# Patient Record
Sex: Male | Born: 1985 | Race: Black or African American | Hispanic: No | Marital: Married | Smoking: Light tobacco smoker
Health system: Southern US, Community
[De-identification: ages and names within clinical notes are randomized; demographics above are authoritative.]

## PROBLEM LIST (undated history)

## (undated) DIAGNOSIS — F32A Depression, unspecified: Secondary | ICD-10-CM

## (undated) DIAGNOSIS — F329 Major depressive disorder, single episode, unspecified: Secondary | ICD-10-CM

## (undated) HISTORY — PX: ROOT CANAL: SHX2363

## (undated) HISTORY — PX: WISDOM TOOTH EXTRACTION: SHX21

## (undated) HISTORY — DX: Depression, unspecified: F32.A

## (undated) HISTORY — DX: Major depressive disorder, single episode, unspecified: F32.9

---
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
## 2009-09-08 ENCOUNTER — Emergency Department: Payer: Self-pay | Admitting: Emergency Medicine

## 2011-09-08 IMAGING — CR DG CHEST 2V
1 series · 2 of 2 positions shown · non-contrast
Comparison: none

REASON FOR EXAM: chest pain
COMMENTS:   May transport without cardiac monitor

PROCEDURE:     DXR - DXR CHEST PA (OR AP) AND LATERAL  - September 08, 2009  [DATE]
RESULT:     The lung fields are clear. The heart, mediastinal and osseous
structures show no significant abnormalities.

[Series 1: view not recorded · 0.17mm/px · 2 of 2 slices shown]
[im 1/2]
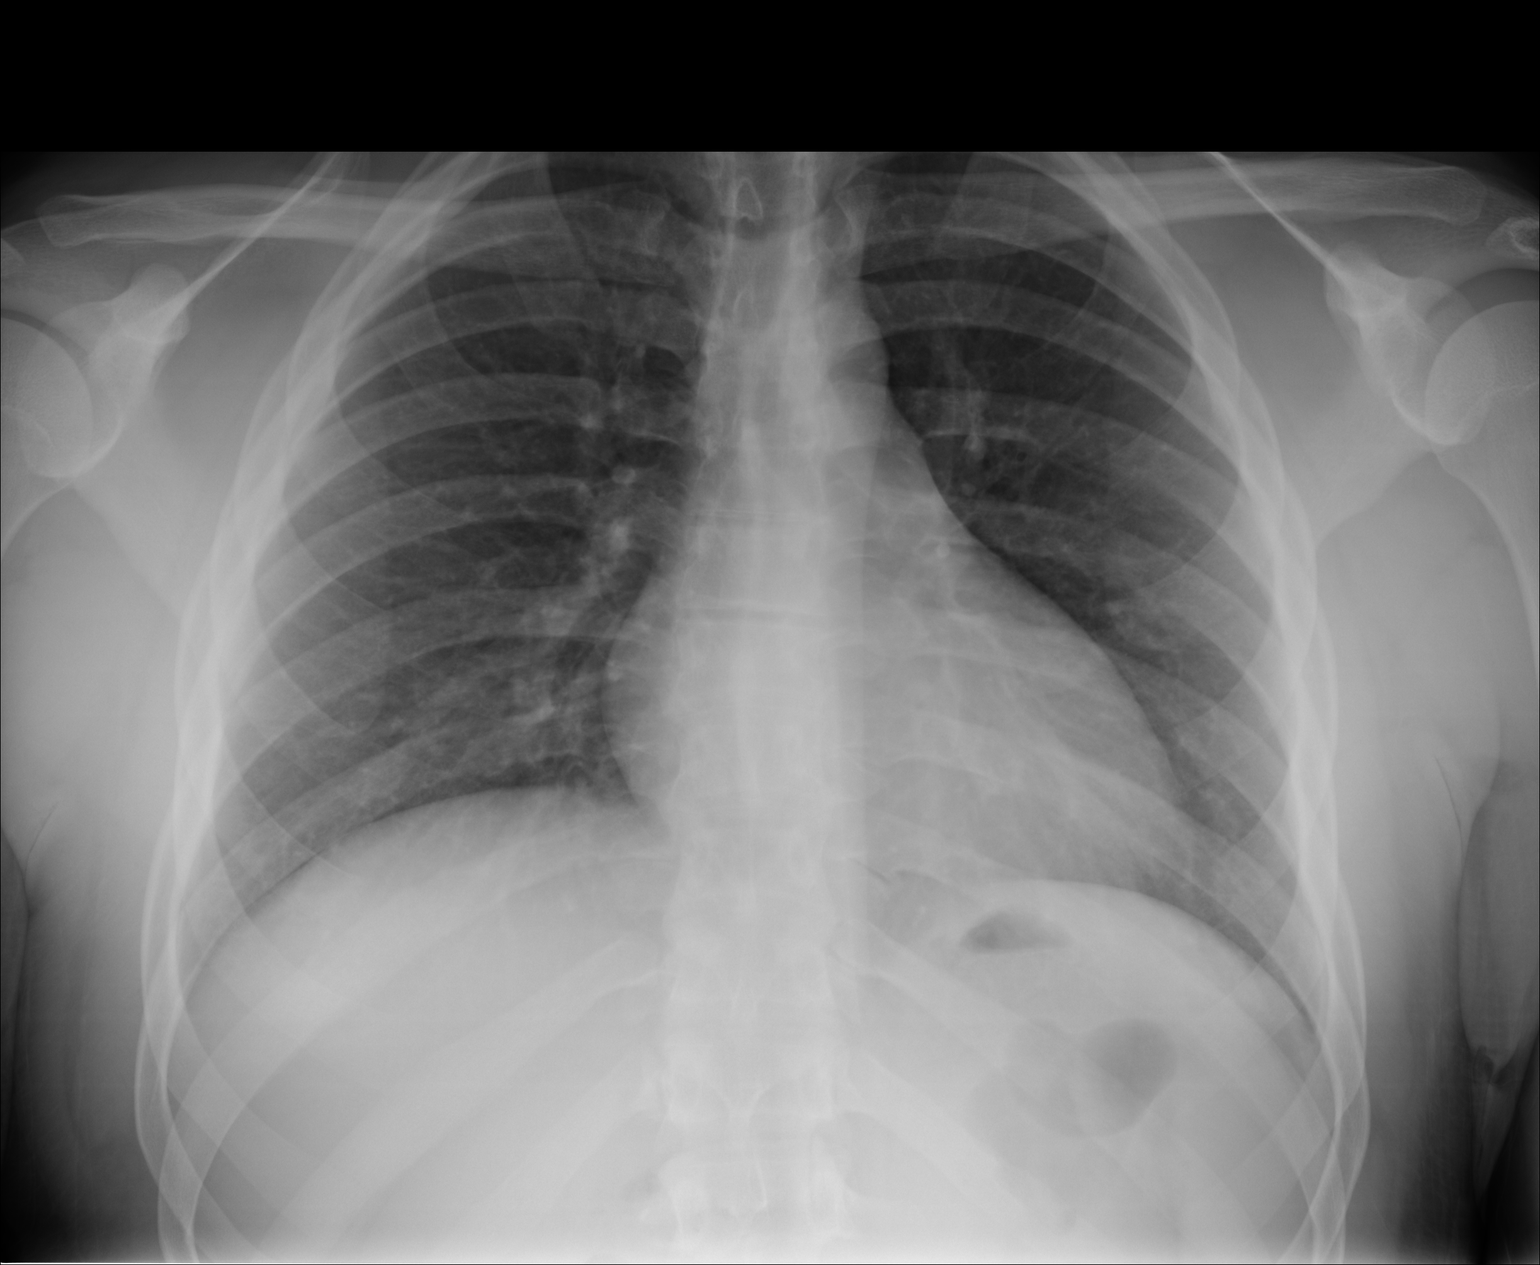
[im 2/2]
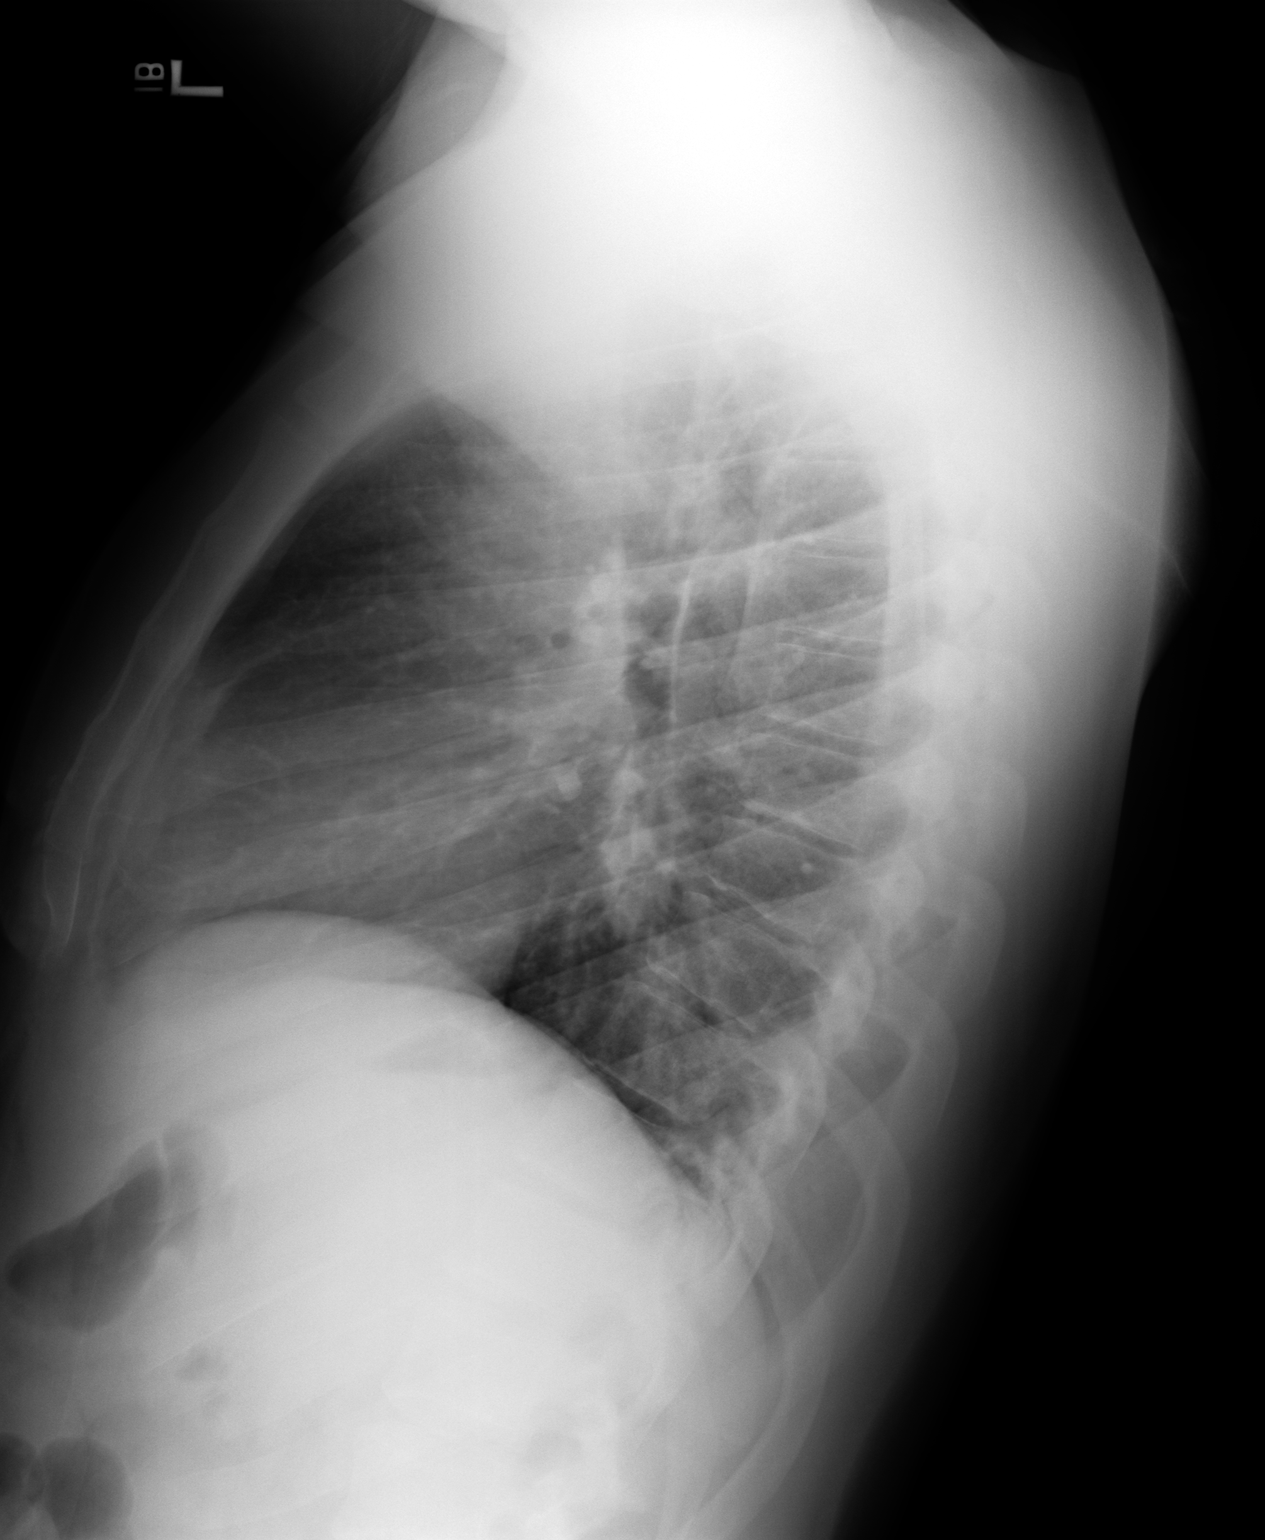

[2 of 2 positions shown; findings below may reference images not displayed]

IMPRESSION: No significant abnormalities are noted.

## 2012-01-24 ENCOUNTER — Emergency Department: Payer: Self-pay | Admitting: Emergency Medicine

## 2012-06-29 ENCOUNTER — Ambulatory Visit: Payer: Self-pay | Admitting: Emergency Medicine

## 2012-08-17 ENCOUNTER — Emergency Department: Payer: Self-pay | Admitting: Emergency Medicine

## 2013-08-29 ENCOUNTER — Ambulatory Visit: Payer: Self-pay

## 2014-10-31 ENCOUNTER — Encounter: Payer: Self-pay | Admitting: Emergency Medicine

## 2014-10-31 ENCOUNTER — Emergency Department
Admission: EM | Admit: 2014-10-31 | Discharge: 2014-10-31 | Disposition: A | Discharge status: Home / Self Care | Payer: Managed Care, Other (non HMO) | Attending: Emergency Medicine | Admitting: Emergency Medicine

## 2014-10-31 DIAGNOSIS — N342 Other urethritis: Secondary | ICD-10-CM | POA: Diagnosis not present

## 2014-10-31 DIAGNOSIS — Z720 Tobacco use: Secondary | ICD-10-CM | POA: Diagnosis not present

## 2014-10-31 DIAGNOSIS — N4889 Other specified disorders of penis: Secondary | ICD-10-CM | POA: Diagnosis present

## 2014-10-31 LAB — CHLAMYDIA/NGC RT PCR (ARMC ONLY)
Chlamydia Tr: NOT DETECTED
N gonorrhoeae: NOT DETECTED

## 2014-10-31 MED ORDER — AZITHROMYCIN 250 MG PO TABS
1000.0000 mg | ORAL_TABLET | Freq: Once | ORAL | Status: AC
  Administered 2014-10-31: 1000 mg via ORAL
  Filled 2014-10-31: qty 4

## 2014-10-31 MED ORDER — CEFTRIAXONE SODIUM 250 MG IJ SOLR
250.0000 mg | Freq: Once | INTRAMUSCULAR | Status: AC
  Administered 2014-10-31: 250 mg via INTRAMUSCULAR
  Filled 2014-10-31: qty 250

## 2014-10-31 NOTE — Discharge Instructions (Signed)
Urethritis, Adult °Urethritis is an inflammation of the tube through which urine exits your bladder (urethra).  °CAUSES °Urethritis is often caused by an infection in your urethra. The infection can be viral, like herpes. The infection can also be bacterial, like gonorrhea. °RISK FACTORS °Risk factors of urethritis include: °· Having sex without using a condom. °· Having multiple sexual partners. °· Having poor hygiene. °SIGNS AND SYMPTOMS °Symptoms of urethritis are less noticeable in women than in men. These symptoms include: °· Burning feeling when you urinate (dysuria). °· Discharge from your urethra. °· Blood in your urine (hematuria). °· Urinating more than usual. °DIAGNOSIS  °To confirm a diagnosis of urethritis, your health care provider will do the following: °· Ask about your sexual history. °· Perform a physical exam. °· Have you provide a sample of your urine for lab testing. °· Use a cotton swab to gently collect a sample from your urethra for lab testing. °TREATMENT  °It is important to treat urethritis. Depending on the cause, untreated urethritis may lead to serious genital infections and possibly infertility. Urethritis caused by a bacterial infection is treated with antibiotic medicine. All sexual partners must be treated.  °HOME CARE INSTRUCTIONS °· Do not have sex until the test results are known and treatment is completed, even if your symptoms go away before you finish treatment. °· If you were prescribed an antibiotic, finish it all even if you start to feel better. °SEEK MEDICAL CARE IF:  °· Your symptoms are not improved in 3 days. °· Your symptoms are getting worse. °· You develop abdominal pain or pelvic pain (in women). °· You develop joint pain. °· You have a fever. °SEEK IMMEDIATE MEDICAL CARE IF:  °· You have severe pain in the belly, back, or side. °· You have repeated vomiting. °MAKE SURE YOU: °· Understand these instructions. °· Will watch your condition. °· Will get help right away  if you are not doing well or get worse. °  °This information is not intended to replace advice given to you by your health care provider. Make sure you discuss any questions you have with your health care provider. °  °Document Released: 06/22/2000 Document Revised: 05/13/2014 Document Reviewed: 08/27/2012 °Elsevier Interactive Patient Education ©2016 [employer] ° °

## 2014-10-31 NOTE — ED Notes (Addendum)
Pt to ED with pain to penis, states he is afraid he may have chlamydia, denies any urinary symptoms

## 2014-10-31 NOTE — ED Notes (Signed)
Patient c/o penile pain. Denies any urinary problems or abnormal discharge

## 2014-10-31 NOTE — ED Provider Notes (Signed)
[facility] Emergency Department Provider Note  ____________________________________________  Time seen: Approximately 2:23 PM  I have reviewed the triage vital signs and the nursing notes.   HISTORY  Chief Complaint SEXUALLY TRANSMITTED DISEASE    HPI Lance Harvey is a 29 y.o. male presents for evaluation of the penis pain. States he is a very may have Chlamydia. States that he had oral sex unprotected yesterday. Denies any known discharge.   History reviewed. No pertinent past medical history.  There are no active problems to display for this patient.   History reviewed. No pertinent past surgical history.  No current outpatient prescriptions on file.  Allergies Review of patient's allergies indicates no known allergies.  No family history on file.  Social History Social History  Substance Use Topics  . Smoking status: Current Every Day Smoker    Types: Cigarettes, Cigars  . Smokeless tobacco: None  . Alcohol Use: Yes    Review of Systems Constitutional: No fever/chills Eyes: No visual changes. ENT: No sore throat. Cardiovascular: Denies chest pain. Respiratory: Denies shortness of breath. Gastrointestinal: No abdominal pain.  No nausea, no vomiting.  No diarrhea.  No constipation. Genitourinary: Positive for mild dysuria, negative for discharge. Musculoskeletal: Negative for back pain. Skin: Negative for rash. Neurological: Negative for headaches, focal weakness or numbness.  10-point ROS otherwise negative.  ____________________________________________   PHYSICAL EXAM:  VITAL SIGNS: ED Triage Vitals  Enc Vitals Group     BP 10/31/14 1405 146/57 mmHg     Pulse Rate 10/31/14 1405 83     Resp 10/31/14 1405 18     Temp 10/31/14 1405 98.9 F (37.2 C)     Temp Source 10/31/14 1405 Oral     SpO2 10/31/14 1405 99 %     Weight 10/31/14 1405 280 lb (127.007 kg)     Height 10/31/14 1405  (1.88 m)     Head Cir --       Peak Flow --      Pain Score 10/31/14 1405 2     Pain Loc --      Pain Edu? --      Excl. in GC? --     Constitutional: Alert and oriented. Well appearing and in no acute distress. Eyes: Conjunctivae are normal. PERRL. EOMI. Head: Atraumatic. Nose: No congestion/rhinnorhea. Mouth/Throat: Mucous membranes are moist.  Oropharynx non-erythematous. Neck: No stridor.   Cardiovascular: Normal rate, regular rhythm. Grossly normal heart sounds.  Good peripheral circulation. Respiratory: Normal respiratory effort.  No retractions. Lungs CTAB. Gastrointestinal: Soft and nontender. No distention. No abdominal bruits. No CVA tenderness. Genitourinary: Uncircumcised man with no obvious discharge. Nontender to palpation. Musculoskeletal: No lower extremity tenderness nor edema.  No joint effusions. Neurologic:  Normal speech and language. No gross focal neurologic deficits are appreciated. No gait instability. Skin:  Skin is warm, dry and intact. No rash noted. Psychiatric: Mood and affect are normal. Speech and behavior are normal.  ____________________________________________   LABS (all labs ordered are listed, but only abnormal results are displayed)  Labs Reviewed  CHLAMYDIA/NGC RT PCR ([facility] ONLY)   ____________________________________________  PROCEDURES  Procedure(s) performed: None  Critical Care performed: No  ____________________________________________   INITIAL IMPRESSION / ASSESSMENT AND PLAN / ED COURSE  Pertinent labs & imaging results that were available during my care of the patient were reviewed by me and considered in my medical decision making (see chart for details).  Acute urethritis. Rx Zithromax 1 g given by mouth while  in the ED along with Rocephin 250 mg IM. Patient follow-up with PCP or return with any worsening or developing symptomology. Patient voices no other emergency medical complaints at this  time. ____________________________________________   FINAL CLINICAL IMPRESSION(S) / ED DIAGNOSES  Final diagnoses:  Urethritis      Lance Dakinharles M Demonica Farrey, PA-C 10/31/14 1601  Sharyn CreamerMark Quale, MD 11/01/14 [phone]

## 2015-05-11 ENCOUNTER — Ambulatory Visit
Admission: EM | Admit: 2015-05-11 | Discharge: 2015-05-11 | Discharge status: Absent without leave | Payer: Managed Care, Other (non HMO)

## 2015-05-12 ENCOUNTER — Ambulatory Visit (INDEPENDENT_AMBULATORY_CARE_PROVIDER_SITE_OTHER): Payer: Managed Care, Other (non HMO) | Admitting: Family Medicine

## 2015-05-12 ENCOUNTER — Encounter: Payer: Self-pay | Admitting: Family Medicine

## 2015-05-12 VITALS — BP 110/78 | HR 68 | Resp 16 | Ht 74.0 in | Wt 237.0 lb

## 2015-05-12 DIAGNOSIS — R079 Chest pain, unspecified: Secondary | ICD-10-CM | POA: Diagnosis not present

## 2015-05-12 DIAGNOSIS — Z720 Tobacco use: Secondary | ICD-10-CM

## 2015-05-12 DIAGNOSIS — F418 Other specified anxiety disorders: Secondary | ICD-10-CM | POA: Diagnosis not present

## 2015-05-12 DIAGNOSIS — F419 Anxiety disorder, unspecified: Secondary | ICD-10-CM

## 2015-05-12 DIAGNOSIS — F410 Panic disorder [episodic paroxysmal anxiety] without agoraphobia: Secondary | ICD-10-CM

## 2015-05-12 DIAGNOSIS — F17200 Nicotine dependence, unspecified, uncomplicated: Secondary | ICD-10-CM | POA: Insufficient documentation

## 2015-05-12 DIAGNOSIS — Z23 Encounter for immunization: Secondary | ICD-10-CM | POA: Diagnosis not present

## 2015-05-12 DIAGNOSIS — F329 Major depressive disorder, single episode, unspecified: Secondary | ICD-10-CM

## 2015-05-12 DIAGNOSIS — E669 Obesity, unspecified: Secondary | ICD-10-CM | POA: Diagnosis not present

## 2015-05-12 MED ORDER — CITALOPRAM HYDROBROMIDE 20 MG PO TABS: 20.0000 mg | ORAL_TABLET | Freq: Every day | ORAL | Status: DC

## 2015-05-12 NOTE — Progress Notes (Signed)
Date:  05/12/2015   Name:  Lance Harvey   DOB:  27-Apr-1985   MRN:  [id number]  PCP:  Schuyler Amor, MD    Chief Complaint: Establish Care and Anxiety   History of Present Illness:  This is a 30 y.o. male seen for initial visit. Developed diffuse chest pain and palpitations last night, now resolved. BP 140/81 at work. 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 Irritable, can't calm down, gets panic episodes where needs to sit in dark room or go for drive. Smokes few cigs and MJH daily but wants to quit. Denies sign alcohol use. Weight down 25-30# recently with increased exercise at work. Never on psych med in past. Father with DM and cardiac stents, mother with HTN, brothers healthy. Tet imm status unknown.  Review of Systems:  Review of Systems  Constitutional: Negative for fever and chills.  Respiratory: Negative for cough and shortness of breath.   Cardiovascular: Negative for leg swelling.  Endocrine: Negative for polyuria.  Genitourinary: Negative for difficulty urinating.  Neurological: Negative for syncope and light-headedness.    Patient Active Problem List   Diagnosis Date Noted  . Smoker 05/12/2015  . Panic disorder 05/12/2015    Prior to Admission medications   Medication Sig Start Date End Date Taking? Authorizing Provider  citalopram (CELEXA) 20 MG tablet Take 1 tablet (20 mg total) by mouth daily. 05/12/15   Schuyler Amor, MD    No Known Allergies  Past Surgical History  Procedure Laterality Date  . Wisdom tooth extraction    . Root canal      Social History  Substance Use Topics  . Smoking status: Light Tobacco Smoker    Types: Cigarettes, Cigars  . Smokeless tobacco: Never Used  . Alcohol Use: Yes    Family History  Problem Relation Age of Onset  . Hypertension Mother   . Heart attack Father 54    stents  . Diabetes Father     Medication list has been reviewed and  updated.  Physical Examination: BP 110/78 mmHg  Pulse 68  Resp 16  Ht  (1.88 m)  Wt 237 lb (107.502 kg)  BMI 30.42 kg/m2  SpO2 100%  Physical Exam  Constitutional: He is oriented to person, place, and time. He appears well-developed and well-nourished.  HENT:  Head: Normocephalic and atraumatic.  Right Ear: External ear normal.  Left Ear: External ear normal.  Nose: Nose normal.  Mouth/Throat: Oropharynx is clear and moist.  TM's clear  Eyes: Conjunctivae and EOM are normal. Pupils are equal, round, and reactive to light.  Neck: Neck supple. No thyromegaly present.  Cardiovascular: Normal rate, regular rhythm and normal heart sounds.   Pulmonary/Chest: Effort normal and breath sounds normal.  Abdominal: Soft. He exhibits no distension and no mass. There is no tenderness.  Musculoskeletal: He exhibits no edema.  Lymphadenopathy:    He has no cervical adenopathy.  Neurological: He is alert and oriented to person, place, and time. Coordination normal.  Skin: Skin is warm and dry.  Psychiatric: His behavior is normal.  Anxious affect with pressured speech  Nursing note and vitals reviewed.   Assessment and Plan:  1. Chest pain, unspecified chest pain type EKG sinus brady only, CP/palpitations likely anxiety-related - EKG 12-Lead  2. Anxiety and depression Begin Celexa 20 mg daily  3. Smoker Discussed cessation, pt requested Chantix but declined as may worsen anxiety  4. Obesity, Class I, BMI  30-34.9 - Comprehensive Metabolic Panel (CMET) - CBC - TSH - Lipid Profile  5. Need for tetanus booster - Tdap vaccine greater than or equal to 7yo IM  Return in about 4 weeks (around 06/09/2015).  Dionne AnoWilliam M. Kingsley SpittlePlonk, Jr. MD [facility]  05/12/2015

## 2015-06-25 ENCOUNTER — Telehealth: Payer: Self-pay

## 2015-06-26 ENCOUNTER — Encounter: Payer: Self-pay | Admitting: Family Medicine

## 2015-06-26 ENCOUNTER — Ambulatory Visit (INDEPENDENT_AMBULATORY_CARE_PROVIDER_SITE_OTHER): Payer: Managed Care, Other (non HMO) | Admitting: Family Medicine

## 2015-06-26 VITALS — BP 124/80 | HR 80 | Ht 74.0 in | Wt 244.0 lb

## 2015-06-26 DIAGNOSIS — F410 Panic disorder [episodic paroxysmal anxiety] without agoraphobia: Secondary | ICD-10-CM

## 2015-06-26 DIAGNOSIS — F329 Major depressive disorder, single episode, unspecified: Secondary | ICD-10-CM

## 2015-06-26 DIAGNOSIS — F32A Depression, unspecified: Secondary | ICD-10-CM | POA: Insufficient documentation

## 2015-06-26 MED ORDER — CITALOPRAM HYDROBROMIDE 40 MG PO TABS: 40.0000 mg | ORAL_TABLET | Freq: Every day | ORAL | Status: DC

## 2015-06-26 NOTE — Progress Notes (Signed)
Date:  06/26/2015   Name:  Myrtice LauthChadrick J Lemieux   DOB:  04/08/1985   MRN:  [id number]  PCP:  Schuyler AmorWilliam Janai Maudlin, MD    Chief Complaint: Depression   History of Present Illness:  This is a 30 y.o. male placed on Celexa for panic d/o last visit six weeks ago, says panic sxs have resolved but still quite depressed. Has heard Prozac might help.  Review of Systems:  Review of Systems  Constitutional: Negative for fever.  Respiratory: Negative for cough and shortness of breath.   Cardiovascular: Negative for chest pain and leg swelling.  Neurological: Negative for syncope and light-headedness.  Psychiatric/Behavioral: Negative for suicidal ideas and self-injury.    Patient Active Problem List   Diagnosis Date Noted  . Smoker 05/12/2015  . Panic disorder 05/12/2015    Prior to Admission medications   Medication Sig Start Date End Date Taking? Authorizing Provider  citalopram (CELEXA) 40 MG tablet Take 1 tablet (40 mg total) by mouth daily. 06/26/15  Yes Schuyler AmorWilliam Craig Wisnewski, MD  Multiple Vitamin (MULTIVITAMIN) capsule Take 1 capsule by mouth daily.   Yes Historical Provider, MD    No Known Allergies  Past Surgical History  Procedure Laterality Date  . Wisdom tooth extraction    . Root canal      Social History  Substance Use Topics  . Smoking status: Light Tobacco Smoker    Types: Cigarettes, Cigars  . Smokeless tobacco: Never Used  . Alcohol Use: Yes    Family History  Problem Relation Age of Onset  . Hypertension Mother   . Heart attack Father 54    stents  . Diabetes Father     Medication list has been reviewed and updated.  Physical Examination: BP 124/80 mmHg  Pulse 80  Ht 6\' 2"  (1.88 m)  Wt 244 lb (110.678 kg)  BMI 31.31 kg/m2  Physical Exam  Constitutional: He appears well-developed and well-nourished.  Cardiovascular: Normal rate, regular rhythm and normal heart sounds.   Pulmonary/Chest: Effort normal and breath sounds normal.  Musculoskeletal: He exhibits no  edema.  Neurological: He is alert.  Skin: Skin is warm and dry.  Psychiatric: His behavior is normal.  Tearful  Nursing note and vitals reviewed.   Assessment and Plan:  1. Depression Increase Celexa to 40 mg daily, consider change to Prozac if Celexa ineffective - TSH - Comprehensive metabolic panel - CBC - B12  2. Panic disorder Sxs resolved on Celexa   Return in about 2 weeks (around 07/10/2015).  Dionne AnoWilliam M. Kingsley SpittlePlonk, Jr. MD [facility]  06/26/2015

## 2015-06-26 NOTE — Telephone Encounter (Signed)
Pt came in to see Dr Hollace HaywardPlonk today

## 2015-06-27 LAB — COMPREHENSIVE METABOLIC PANEL
A/G RATIO: 2 (ref 1.2–2.2)
ALBUMIN: 4.7 g/dL (ref 3.5–5.5)
ALK PHOS: 59 IU/L (ref 39–117)
ALT: 15 IU/L (ref 0–44)
AST: 14 IU/L (ref 0–40)
BUN / CREAT RATIO: 22 — AB (ref 9–20)
BUN: 22 mg/dL — ABNORMAL HIGH (ref 6–20)
Bilirubin Total: 0.5 mg/dL (ref 0.0–1.2)
CO2: 22 mmol/L (ref 18–29)
CREATININE: 1.01 mg/dL (ref 0.76–1.27)
Calcium: 9.5 mg/dL (ref 8.7–10.2)
Chloride: 101 mmol/L (ref 96–106)
GFR calc Af Amer: 117 mL/min/{1.73_m2} (ref >59)
GFR, EST NON AFRICAN AMERICAN: 101 mL/min/{1.73_m2} (ref >59)
GLOBULIN, TOTAL: 2.3 g/dL (ref 1.5–4.5)
Glucose: 86 mg/dL (ref 65–99)
POTASSIUM: 4.7 mmol/L (ref 3.5–5.2)
Sodium: 140 mmol/L (ref 134–144)
Total Protein: 7 g/dL (ref 6.0–8.5)

## 2015-06-27 LAB — CBC
Hematocrit: 46.6 % (ref 37.5–51.0)
Hemoglobin: 15.7 g/dL (ref 12.6–17.7)
MCH: 27 pg (ref 26.6–33.0)
MCHC: 33.7 g/dL (ref 31.5–35.7)
MCV: 80 fL (ref 79–97)
PLATELETS: 236 10*3/uL (ref 150–379)
RBC: 5.81 x10E6/uL — AB (ref 4.14–5.80)
RDW: 14.8 % (ref 12.3–15.4)
WBC: 6.4 10*3/uL (ref 3.4–10.8)

## 2015-06-27 LAB — VITAMIN B12: VITAMIN B 12: 534 pg/mL (ref 211–946)

## 2015-06-27 LAB — TSH: TSH: 1.1 u[IU]/mL (ref 0.450–4.500)

## 2015-08-29 IMAGING — CR RIGHT ANKLE - COMPLETE 3+ VIEW
3 series · 3 of 3 positions shown · non-contrast
Comparison: None.

CLINICAL DATA: The patient is a runner, ankle pain but no acute
injury

EXAM:
RIGHT ANKLE - COMPLETE 3+ VIEW

[ankle ap]
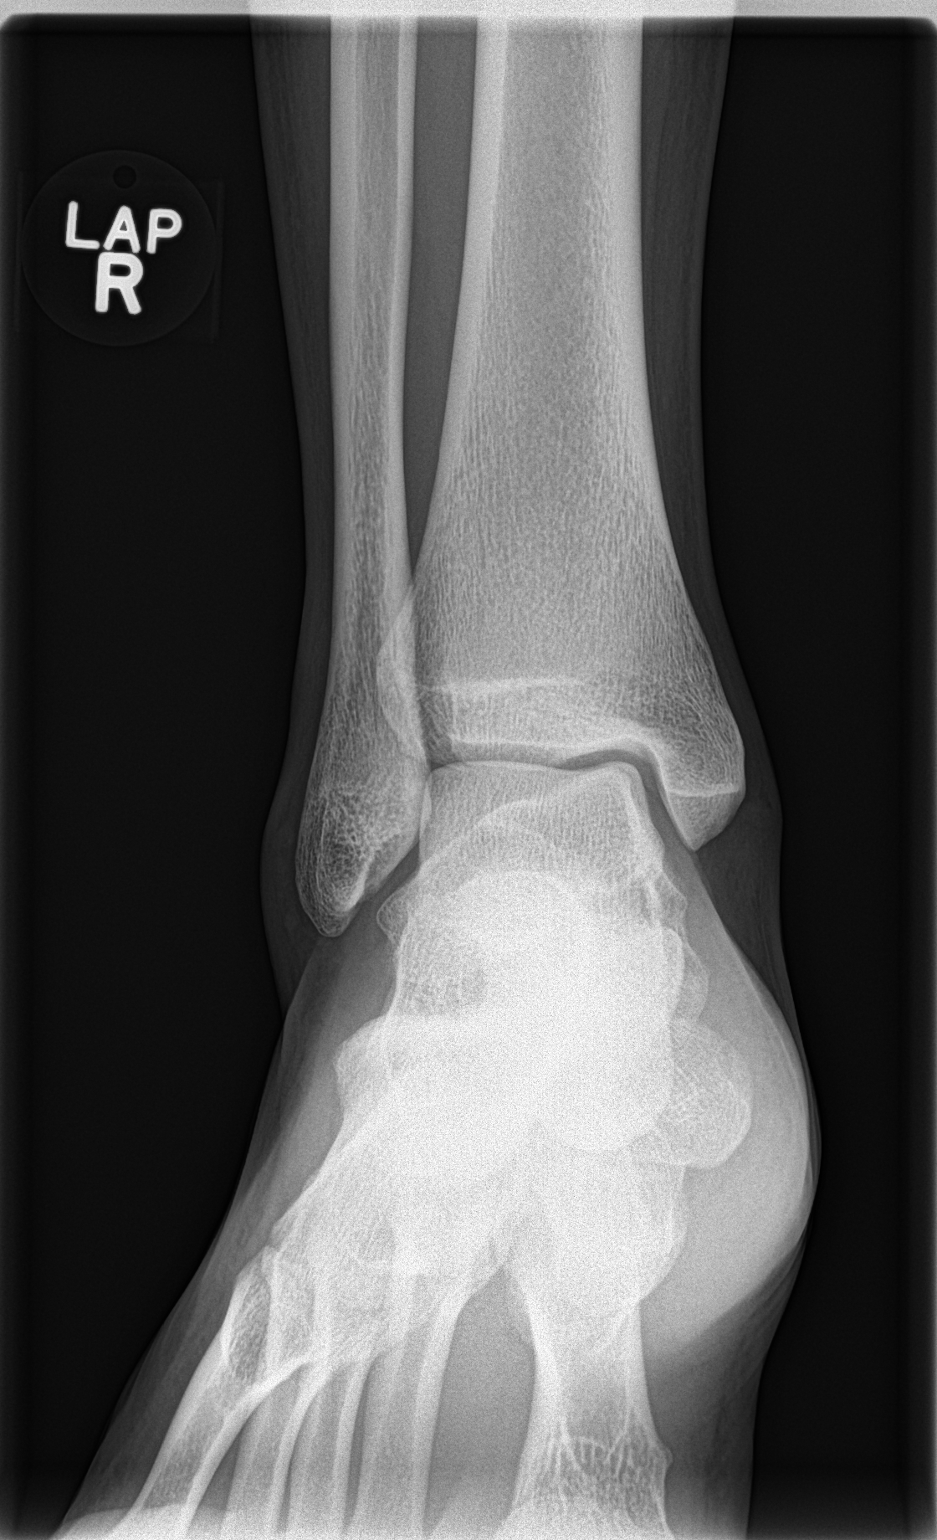

[ankle obl]
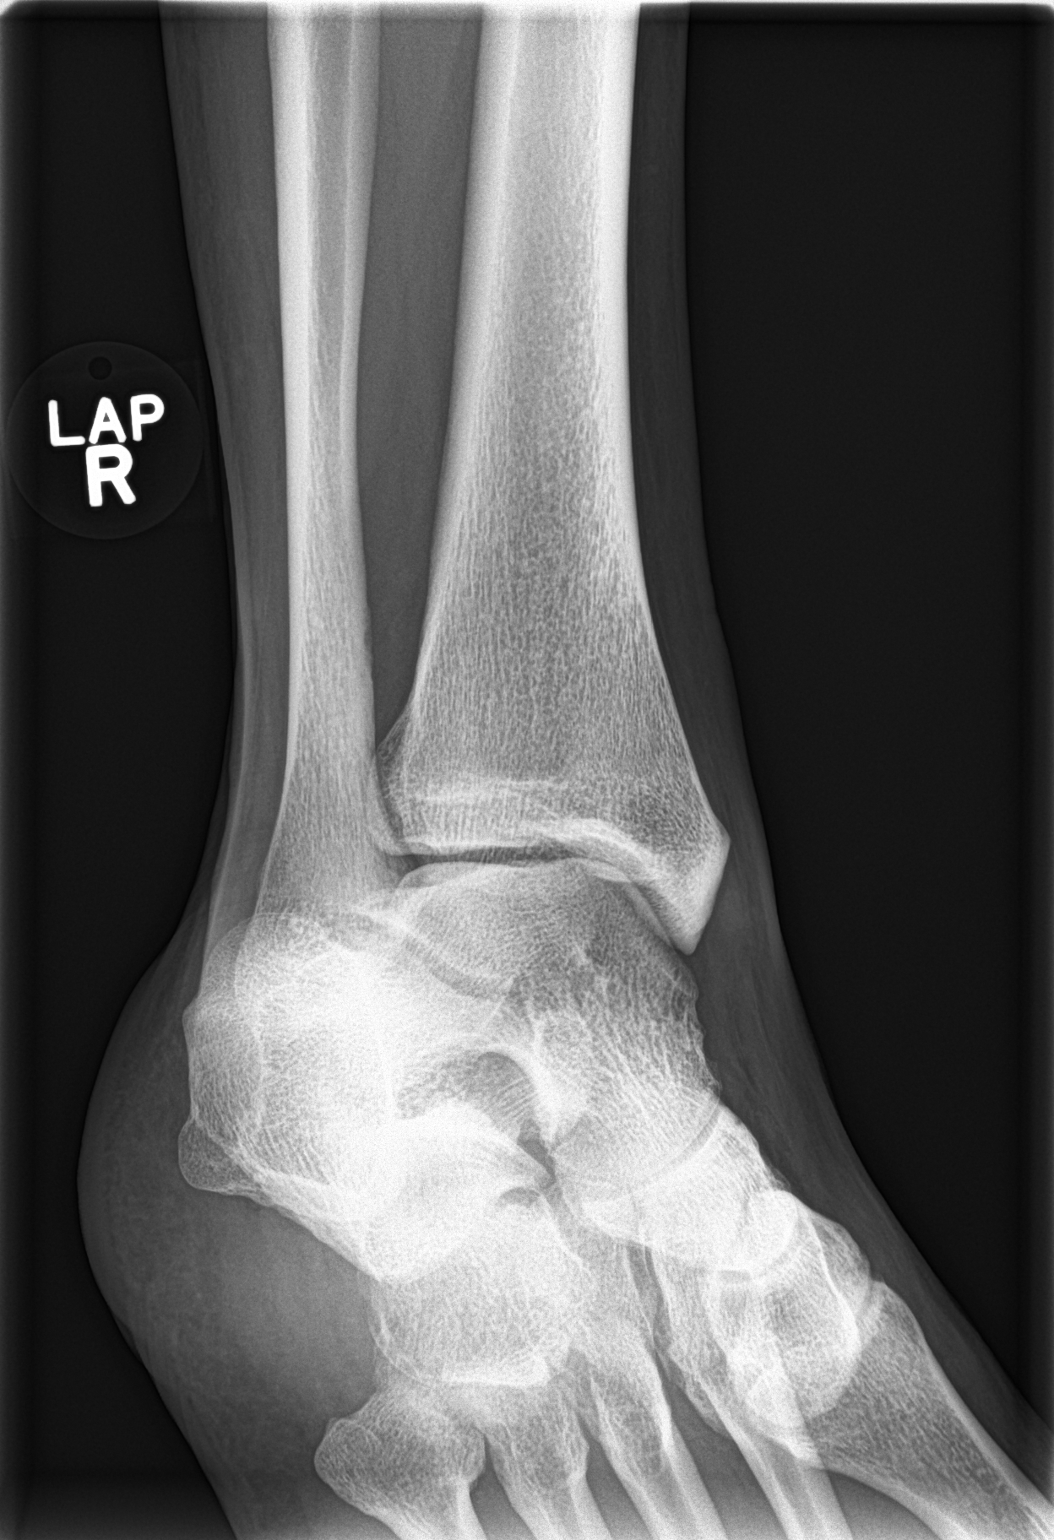

[ankle lat]
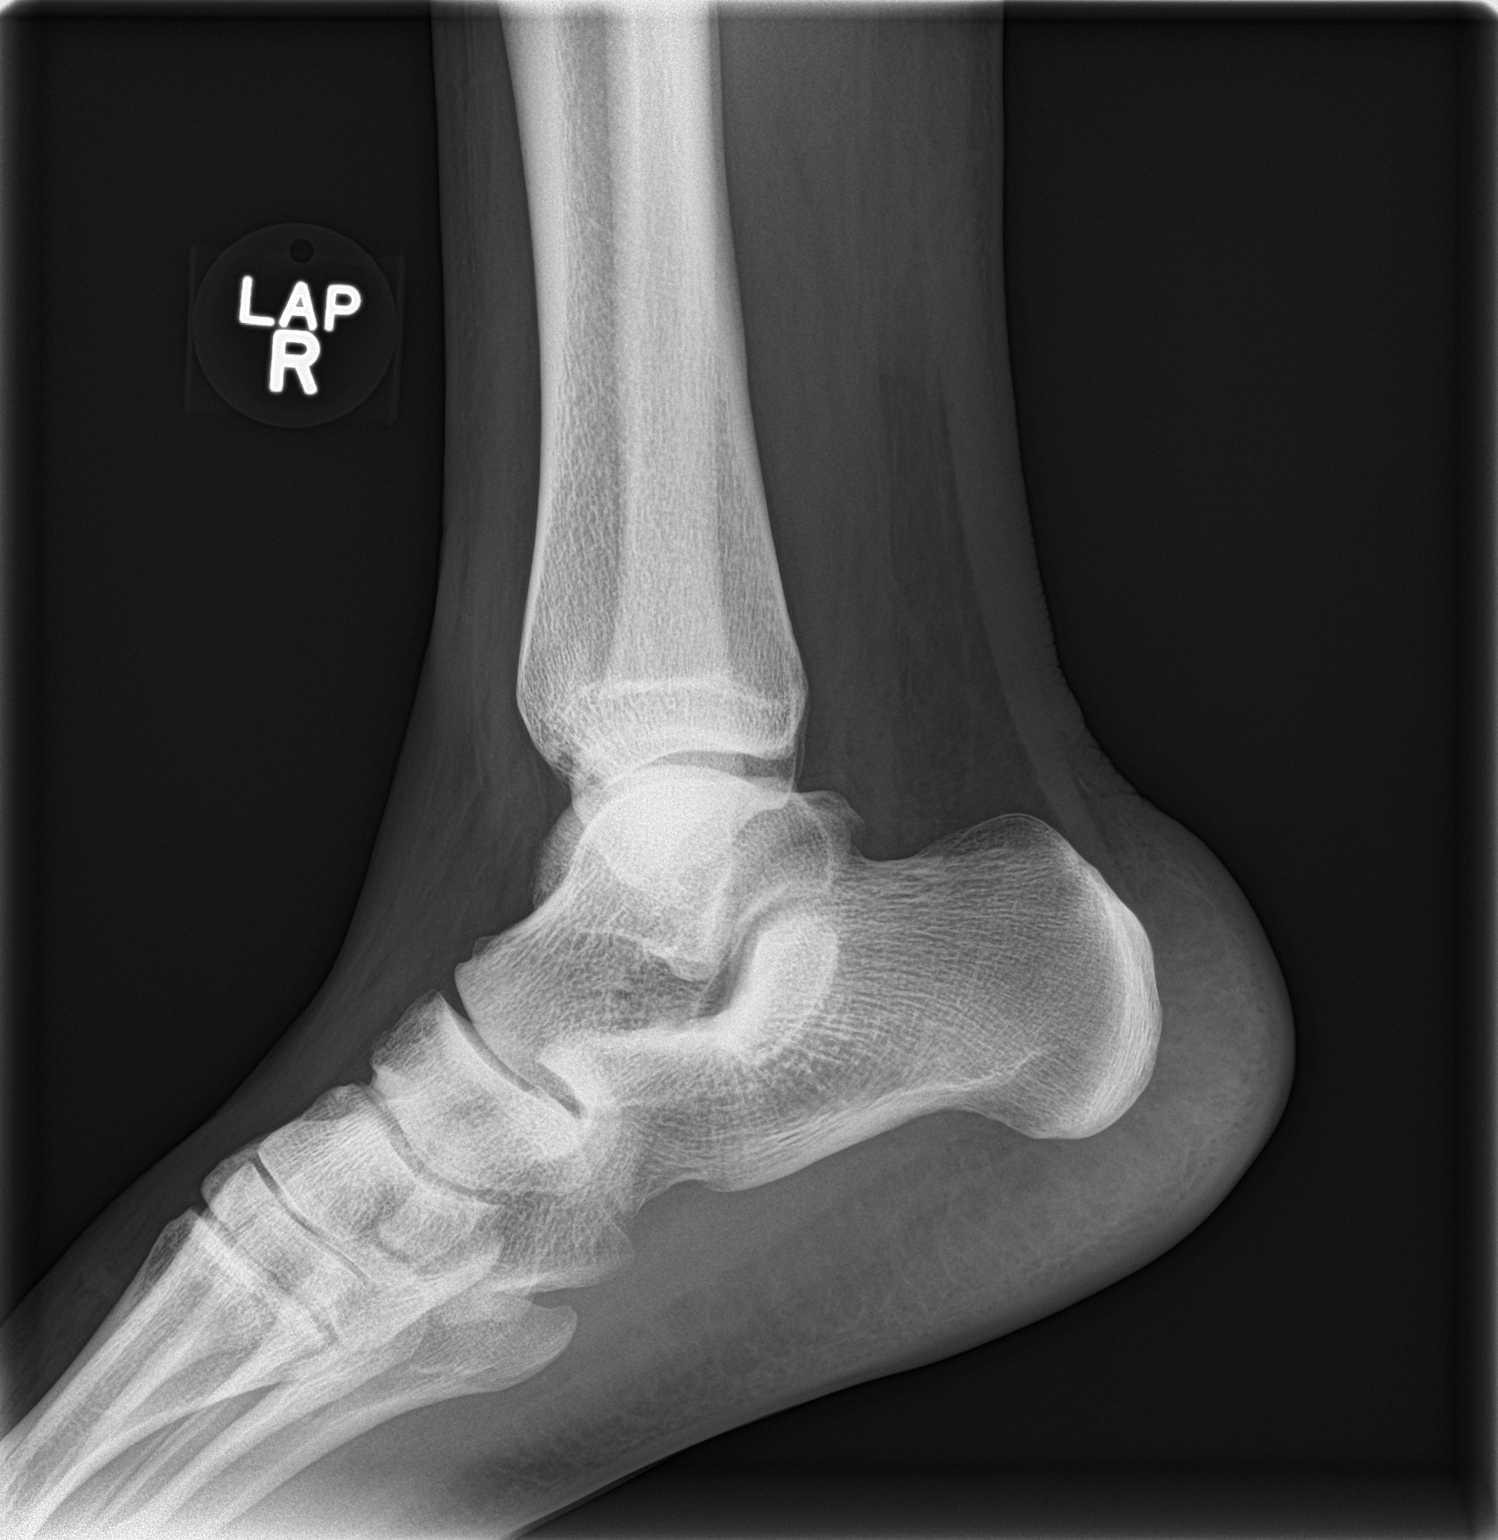

[3 of 3 positions shown; findings below may reference images not displayed]

FINDINGS: The ankle joint appears normal. No fracture is seen. Alignment is
normal.
IMPRESSION: Negative.

## 2015-10-12 ENCOUNTER — Other Ambulatory Visit: Payer: Self-pay | Admitting: Internal Medicine

## 2015-10-12 MED ORDER — CITALOPRAM HYDROBROMIDE 40 MG PO TABS: 40.0000 mg | ORAL_TABLET | Freq: Every day | ORAL | 0 refills | Status: DC

## 2015-12-16 ENCOUNTER — Other Ambulatory Visit: Payer: Self-pay | Admitting: Internal Medicine

## 2015-12-16 NOTE — Telephone Encounter (Signed)
Number has been changed or disconnected unable to reach pt

## 2016-01-12 ENCOUNTER — Other Ambulatory Visit: Payer: Self-pay | Admitting: Internal Medicine

## 2016-01-13 ENCOUNTER — Telehealth: Payer: Self-pay

## 2016-01-13 NOTE — Telephone Encounter (Deleted)
Will see Lance Harvey Friday but states they will run out of meds before that and need meds because will be unsafe to miss a dose while on Celexa. I see we filled it on 12/6 and that means they should get new RX from [facility] Friday but please review and let me know what to do so I can call. I tried once to call and verify that they got the last Rx on 12/6.

## 2016-01-13 NOTE — Telephone Encounter (Signed)
Wife called stating they have appt Fri to see [facility] as Establish Care but can not go without Celexa until then as missing a dose is dangerous. I tried 3 times to call her back to discuss few things, I see last OV June so I am not clear when or where was last REFILL done. Could not reach patient. Please advise me on what to do.

## 2016-01-15 ENCOUNTER — Telehealth: Payer: Self-pay

## 2016-01-15 ENCOUNTER — Ambulatory Visit: Payer: Managed Care, Other (non HMO) | Admitting: Family Medicine

## 2016-01-15 ENCOUNTER — Other Ambulatory Visit: Payer: Self-pay | Admitting: Family Medicine

## 2016-01-15 MED ORDER — CITALOPRAM HYDROBROMIDE 40 MG PO TABS: 40.0000 mg | ORAL_TABLET | Freq: Every day | ORAL | 0 refills | Status: DC

## 2016-01-15 NOTE — Telephone Encounter (Signed)
Left message to reschedule and said that we called that in with agreement that patient would keep appt and we WILL NOT be filling anything else until seen.

## 2016-01-15 NOTE — Telephone Encounter (Signed)
Will call in one month Celexa.

## 2016-01-15 NOTE — Telephone Encounter (Signed)
Pt did not come in today- I see where Plonk called in a refill on his med (which I think he was supposed to be seen for today), please reschedule his appt if he is not scheduled already- thank you

## 2016-01-15 NOTE — Telephone Encounter (Signed)
noted 

## 2016-03-16 ENCOUNTER — Other Ambulatory Visit: Payer: Self-pay | Admitting: Family Medicine

## 2016-03-31 ENCOUNTER — Encounter: Payer: Self-pay | Admitting: Family Medicine

## 2016-03-31 ENCOUNTER — Ambulatory Visit (INDEPENDENT_AMBULATORY_CARE_PROVIDER_SITE_OTHER): Payer: Commercial Managed Care - PPO | Admitting: Family Medicine

## 2016-03-31 VITALS — BP 120/62 | HR 64 | Ht 74.0 in | Wt 254.0 lb

## 2016-03-31 DIAGNOSIS — E663 Overweight: Secondary | ICD-10-CM

## 2016-03-31 DIAGNOSIS — F419 Anxiety disorder, unspecified: Principal | ICD-10-CM

## 2016-03-31 DIAGNOSIS — F329 Major depressive disorder, single episode, unspecified: Secondary | ICD-10-CM

## 2016-03-31 DIAGNOSIS — F418 Other specified anxiety disorders: Secondary | ICD-10-CM

## 2016-03-31 MED ORDER — CITALOPRAM HYDROBROMIDE 40 MG PO TABS: 40.0000 mg | ORAL_TABLET | Freq: Every day | ORAL | 3 refills | Status: AC

## 2016-03-31 NOTE — Patient Instructions (Signed)

## 2016-03-31 NOTE — Progress Notes (Signed)
Name: Lance Harvey   MRN: [id number]    DOB: 1985/08/31   Date:03/31/2016       Progress Note  Subjective  Chief Complaint  Chief Complaint  Patient presents with  . Depression    Depression         This is a recurrent problem.  The current episode started more than 1 year ago.   The onset quality is sudden.   The problem has been gradually improving since onset.  Associated symptoms include no decreased concentration, no fatigue, no helplessness, no hopelessness, does not have insomnia, not irritable, no restlessness, no decreased interest, no appetite change, no body aches, no myalgias, no headaches, no indigestion, not sad and no suicidal ideas.  Past treatments include SSRIs - Selective serotonin reuptake inhibitors.  Compliance with treatment is good.  Past compliance problems include difficulty understanding directions.  Previous treatment provided moderate relief.   Pertinent negatives include no chronic fatigue syndrome.   No problem-specific Assessment & Plan notes found for this encounter.   Past Medical History:  Diagnosis Date  . Depression     Past Surgical History:  Procedure Laterality Date  . ROOT CANAL    . WISDOM TOOTH EXTRACTION      Family History  Problem Relation Age of Onset  . Hypertension Mother   . Heart attack Father 54    stents  . Diabetes Father     Social History   Social History  . Marital status: Married    Spouse name: N/A  . Number of children: N/A  . Years of education: N/A   Occupational History  . Not on file.   Social History Main Topics  . Smoking status: Light Tobacco Smoker    Types: Cigarettes, Cigars  . Smokeless tobacco: Never Used  . Alcohol use Yes  . Drug use: Yes    Types: Marijuana  . Sexual activity: Not on file   Other Topics Concern  . Not on file   Social History Narrative  . No narrative on file    No Known Allergies  Outpatient Medications Prior to Visit  Medication Sig Dispense Refill  .  Multiple Vitamin (MULTIVITAMIN) capsule Take 1 capsule by mouth daily.    . citalopram (CELEXA) 40 MG tablet Take 1 tablet (40 mg total) by mouth daily. 30 tablet 0   No facility-administered medications prior to visit.     Review of Systems  Constitutional: Negative for appetite change, chills, fatigue, fever, malaise/fatigue and weight loss.  HENT: Negative for ear discharge, ear pain and sore throat.   Eyes: Negative for blurred vision.  Respiratory: Negative for cough, sputum production, shortness of breath and wheezing.   Cardiovascular: Negative for chest pain, palpitations and leg swelling.  Gastrointestinal: Negative for abdominal pain, blood in stool, constipation, diarrhea, heartburn, melena and nausea.  Genitourinary: Negative for dysuria, frequency, hematuria and urgency.  Musculoskeletal: Negative for back pain, joint pain, myalgias and neck pain.  Skin: Negative for rash.  Neurological: Negative for dizziness, tingling, sensory change, focal weakness and headaches.  Endo/Heme/Allergies: Negative for environmental allergies and polydipsia. Does not bruise/bleed easily.  Psychiatric/Behavioral: Positive for depression. Negative for decreased concentration and suicidal ideas. The patient is not nervous/anxious and does not have insomnia.      Objective  Vitals:   03/31/16 1353  BP: 120/62  Pulse: 64  Weight: 254 lb (115.2 kg)  Height: 6\' 2"  (1.88 m)    Physical Exam  Constitutional: He is oriented to person,  place, and time and well-developed, well-nourished, and in no distress. He is not irritable.  HENT:  Head: Normocephalic.  Right Ear: Tympanic membrane, external ear and ear canal normal.  Left Ear: Tympanic membrane, external ear and ear canal normal.  Nose: Nose normal.  Mouth/Throat: Oropharynx is clear and moist.  Eyes: Conjunctivae and EOM are normal. Pupils are equal, round, and reactive to light. Right eye exhibits no discharge. Left eye exhibits no  discharge. No scleral icterus.  Neck: Normal range of motion. Neck supple. No JVD present. No tracheal deviation present. No thyromegaly present.  Cardiovascular: Normal rate, regular rhythm, normal heart sounds and intact distal pulses.  Exam reveals no gallop and no friction rub.   No murmur heard. Pulmonary/Chest: Breath sounds normal. No respiratory distress. He has no wheezes. He has no rales.  Abdominal: Soft. Bowel sounds are normal. He exhibits no mass. There is no hepatosplenomegaly. There is no tenderness. There is no rebound, no guarding and no CVA tenderness.  Musculoskeletal: Normal range of motion. He exhibits no edema or tenderness.  Lymphadenopathy:    He has no cervical adenopathy.  Neurological: He is alert and oriented to person, place, and time. He has normal sensation, normal strength and intact cranial nerves. No cranial nerve deficit.  Skin: Skin is warm. No rash noted.  Psychiatric: Mood and affect normal.  Nursing note and vitals reviewed.     Assessment & Plan  Problem List Items Addressed This Visit    None    Visit Diagnoses    Anxiety and depression    -  Primary   Relevant Medications   citalopram (CELEXA) 40 MG tablet   Overweight          Meds ordered this encounter  Medications  . citalopram (CELEXA) 40 MG tablet    Sig: Take 1 tablet (40 mg total) by mouth daily.    Dispense:  90 tablet    Refill:  3      Dr. Elizabeth Sauereanna Emaley Applin [facility]  03/31/16

## 2016-04-01 ENCOUNTER — Ambulatory Visit: Payer: Managed Care, Other (non HMO) | Admitting: Family Medicine

## 2016-08-10 ENCOUNTER — Other Ambulatory Visit: Payer: Self-pay

## 2016-08-10 ENCOUNTER — Emergency Department
Admission: EM | Admit: 2016-08-10 | Discharge: 2016-08-10 | Disposition: A | Discharge status: Home / Self Care | Payer: Commercial Managed Care - PPO | Attending: Emergency Medicine | Admitting: Emergency Medicine

## 2016-08-10 ENCOUNTER — Encounter: Payer: Self-pay | Admitting: Emergency Medicine

## 2016-08-10 DIAGNOSIS — Z79899 Other long term (current) drug therapy: Secondary | ICD-10-CM | POA: Diagnosis not present

## 2016-08-10 DIAGNOSIS — F17210 Nicotine dependence, cigarettes, uncomplicated: Secondary | ICD-10-CM | POA: Diagnosis not present

## 2016-08-10 DIAGNOSIS — F1910 Other psychoactive substance abuse, uncomplicated: Secondary | ICD-10-CM | POA: Diagnosis not present

## 2016-08-10 DIAGNOSIS — R29898 Other symptoms and signs involving the musculoskeletal system: Secondary | ICD-10-CM

## 2016-08-10 DIAGNOSIS — R451 Restlessness and agitation: Secondary | ICD-10-CM

## 2016-08-10 DIAGNOSIS — M6281 Muscle weakness (generalized): Secondary | ICD-10-CM | POA: Diagnosis present

## 2016-08-10 NOTE — Discharge Instructions (Signed)
I feel that her agitation may be due to the substances you ingested such as the alcohol and marijuana. Her strength is completely intact at this time. Please refrain from smoking marijuana and using other recreational drugs. Also please refrain her cut back on alcohol use. Please follow-up with your primary care physician for further evaluation of your symptoms.

## 2016-08-10 NOTE — ED Triage Notes (Signed)
Pt in with co right arm heaviness since yest morning, denies any other symptoms. Pt has hx of panic attacks associated with same symptoms. Also has been drinking for over 100 days, states not here for detox.

## 2016-08-10 NOTE — ED Provider Notes (Signed)
[facility] Emergency Department Provider Note   ____________________________________________   First MD Initiated Contact with Patient 08/10/16 0406     (approximate)  I have reviewed the triage vital signs and the nursing notes.   HISTORY  Chief Complaint Extremity Weakness    HPI Lance Harvey is a 31 y.o. male who comes into the hospital today feeling jittery and having some right arm heaviness. The patient reports that he had a few more drinks than normal. He reports that he drink every days for over 120 days. He also reports that he did smoke some marijuana with his cousin. He reports that since then he's felt jittery and his right arm feels heavy. He is unsure if it was a panic attack or something else but he wanted to make sure. He had a sixpack and a double shot of with her. He states his urine is dark as well and he is unsure if he might just be dehydrated. He fell asleep after he smoked and drank and he states that he woke up with his heart racing and his hands feeling sweaty. He states that his heart isn't racing anymore and his hands don't feel sweaty but his arm still feels funny. The patient states that 3 months ago he did some cocaine and he had a panic attack where he was feeling like his heart was racing. The patient is here for evaluation.   Past Medical History:  Diagnosis Date  . Depression     Patient Active Problem List   Diagnosis Date Noted  . Depression 06/26/2015  . Smoker 05/12/2015  . Panic disorder 05/12/2015    Past Surgical History:  Procedure Laterality Date  . ROOT CANAL    . WISDOM TOOTH EXTRACTION      Prior to Admission medications   Medication Sig Start Date End Date Taking? Authorizing Provider  citalopram (CELEXA) 40 MG tablet Take 1 tablet (40 mg total) by mouth daily. 03/31/16   Duanne LimerickJones, Deanna C, MD  Multiple Vitamin (MULTIVITAMIN) capsule Take 1 capsule by mouth daily.    [provider]     Allergies Patient has no known allergies.  Family History  Problem Relation Age of Onset  . Hypertension Mother   . Heart attack Father 54       stents  . Diabetes Father     Social History Social History  Substance Use Topics  . Smoking status: Light Tobacco Smoker    Types: Cigarettes, Cigars  . Smokeless tobacco: Never Used  . Alcohol use Yes    Review of Systems  Constitutional: No fever/chills Eyes: No visual changes. ENT: No sore throat. Cardiovascular: Palpitations Respiratory: Denies shortness of breath. Gastrointestinal: No abdominal pain.  No nausea, no vomiting.  No diarrhea.  No constipation. Genitourinary: Negative for dysuria. Musculoskeletal: Negative for back pain. Skin: Negative for rash. Neurological: Right arm heaviness   ____________________________________________   PHYSICAL EXAM:  VITAL SIGNS: ED Triage Vitals  Enc Vitals Group     BP 08/10/16 0353 (!) 153/80     Pulse Rate 08/10/16 0353 72     Resp 08/10/16 0353 20     Temp 08/10/16 0353 97.6 F (36.4 C)     Temp Source 08/10/16 0353 Oral     SpO2 08/10/16 0353 99 %     Weight 08/10/16 0354 245 lb (111.1 kg)     Height 08/10/16 0354 6\' 2"  (1.88 m)     Head Circumference --  Peak Flow --      Pain Score --      Pain Loc --      Pain Edu? --      Excl. in GC? --     Constitutional: Alert and oriented. Well appearing and in no acute distress. Eyes: Conjunctivae are normal. PERRL. EOMI. Head: Atraumatic. Nose: No congestion/rhinnorhea. Mouth/Throat: Mucous membranes are moist.  Oropharynx non-erythematous. Cardiovascular: Normal rate, regular rhythm. Grossly normal heart sounds.  Good peripheral circulation. Respiratory: Normal respiratory effort.  No retractions. Lungs CTAB. Gastrointestinal: Soft and nontender. No distention. Positive bowel sounds Musculoskeletal: No lower extremity tenderness nor edema.   Neurologic:  Normal speech and language. Cranial nerves II  through XII grossly intact with no focal motor or sensory deficits, the patient has 5 out of 5 strength in his upper and lower extremities no pain on his right arm. No sensory deficits. Skin:  Skin is warm, dry and intact.  Psychiatric: Mood and affect are normal.   ____________________________________________   LABS (all labs ordered are listed, but only abnormal results are displayed)  Labs Reviewed - No data to display ____________________________________________  EKG  none ____________________________________________  RADIOLOGY  No results found.  ____________________________________________   PROCEDURES  Procedure(s) performed: None  Procedures  Critical Care performed: No  ____________________________________________   INITIAL IMPRESSION / ASSESSMENT AND PLAN / ED COURSE  Pertinent labs & imaging results that were available during my care of the patient were reviewed by me and considered in my medical decision making (see chart for details).  This is a 31 year old who comes into the hospital today with some palpitations and feeling as if his arm is heavy. The patient reports that he was smoking a significant amount of marijuana and drank more alcohol than is normal for him. Patient states that he was with his cousin but he is unsure if the marijuana may have been laced with anything else. I asked him if he wanted me to check him to see if there is anything aside from marijuana in his urine and the patient declined. Again he is not tachycardic he is not to Is not hypertensive. He reports that most of his symptoms have improved his arm still feels a little heavy. The patient has no weakness in his arm no chest pain or numbness in his arm. I informed him that I feel that his symptoms are due to the substances he used this evening and he should cut back to avoid having these symptoms again. The patient understands. He'll be discharged home to follow-up.       ____________________________________________   FINAL CLINICAL IMPRESSION(S) / ED DIAGNOSES  Final diagnoses:  Substance abuse  Restlessness and agitation  Arm heaviness      NEW MEDICATIONS STARTED DURING THIS VISIT:  Discharge Medication List as of 08/10/2016  4:43 AM       Note:  This document was prepared using Dragon voice recognition software and may include unintentional dictation errors.    Rebecka ApleyWebster, Allison P, MD 08/10/16 [id number]

## 2016-08-10 NOTE — ED Notes (Addendum)
Pt given water per EDP. When giving pt water pt states "this will make me feel better." Pt in NAD at this time.

## 2016-08-10 NOTE — ED Notes (Addendum)
Pt reports PTA he "drank beer and smoked weed" and went to sleep. Pt states he woke up and had right arm "heaviness and tingling." Pt appears paranoid and anxious at this time, whispering and speaking low stating "I don't want the cops to hear" while looking at an EDT at nurses station.  Pt also states he wants to keep sitting in the chair in the room instead of bed. Pt states he needs the curtain opened all the way due to feeling "scared." Curtains opened for pt. Pt alert and able to answer questions without slurring noted such as location, month and reason for this visit.   Pt is able to move right arm, hand and fingers at this time. Pulses intact. Pt states the heaviness has approved since awaking. Pt also states he would like to eat his [employer] he bought prior to being seen here at the ED, pt notified EDP will need to assess pt first before eating or drinking anything, pt verbalized understanding of this.

## 2016-08-10 NOTE — ED Notes (Signed)
ED Provider at bedside. 

## 2018-10-01 ENCOUNTER — Other Ambulatory Visit: Payer: Self-pay

## 2018-10-01 DIAGNOSIS — Z20822 Contact with and (suspected) exposure to covid-19: Secondary | ICD-10-CM

## 2018-10-03 LAB — NOVEL CORONAVIRUS, NAA: SARS-CoV-2, NAA: NOT DETECTED

## 2020-07-19 ENCOUNTER — Other Ambulatory Visit: Payer: Self-pay

## 2020-07-19 ENCOUNTER — Emergency Department
Admission: EM | Admit: 2020-07-19 | Discharge: 2020-07-19 | Disposition: A | Discharge status: Home / Self Care | Payer: Medicaid Other | Attending: Emergency Medicine | Admitting: Emergency Medicine

## 2020-07-19 DIAGNOSIS — K0889 Other specified disorders of teeth and supporting structures: Secondary | ICD-10-CM | POA: Diagnosis present

## 2020-07-19 DIAGNOSIS — K029 Dental caries, unspecified: Secondary | ICD-10-CM | POA: Insufficient documentation

## 2020-07-19 DIAGNOSIS — K047 Periapical abscess without sinus: Secondary | ICD-10-CM | POA: Insufficient documentation

## 2020-07-19 DIAGNOSIS — F17210 Nicotine dependence, cigarettes, uncomplicated: Secondary | ICD-10-CM | POA: Insufficient documentation

## 2020-07-19 MED ORDER — HYDROCODONE-ACETAMINOPHEN 5-325 MG PO TABS: 1.0000 | ORAL_TABLET | Freq: Four times a day (QID) | ORAL | 0 refills | Status: AC | PRN

## 2020-07-19 MED ORDER — CLINDAMYCIN HCL 300 MG PO CAPS: 300.0000 mg | ORAL_CAPSULE | Freq: Three times a day (TID) | ORAL | 0 refills | Status: AC

## 2020-07-19 NOTE — ED Triage Notes (Signed)
Pt comes pov with dental pain right sided upper for 2 days. Denies fevers.

## 2020-07-19 NOTE — ED Provider Notes (Signed)
[facility] Emergency Department Provider Note  ____________________________________________   Event Date/Time   First MD Initiated Contact with Patient 07/19/20 [phone]     (approximate)  I have reviewed the triage vital signs and the nursing notes.   HISTORY  Chief Complaint Dental Pain   HPI Lance Harvey is a 35 y.o. male presents to the ED with complaint of right upper dental pain for the last 2 days.  Patient denies any fever or chills.  Patient does continue to smoke cigarettes.  He reports that he does have a dentist in [place].  He states that the tooth that is involved currently broke approximately 6 to 8 months ago.  He rates his pain as a 9 out of 10.         Past Medical History:  Diagnosis Date   Depression     Patient Active Problem List   Diagnosis Date Noted   Depression 06/26/2015   Smoker 05/12/2015   Panic disorder 05/12/2015    Past Surgical History:  Procedure Laterality Date   ROOT CANAL     WISDOM TOOTH EXTRACTION      Prior to Admission medications   Medication Sig Start Date End Date Taking? Authorizing Provider  clindamycin (CLEOCIN) 300 MG capsule Take 1 capsule (300 mg total) by mouth 3 (three) times daily for 10 days. 07/19/20 07/29/20 Yes Tommi Rumps, PA-C  HYDROcodone-acetaminophen (NORCO/VICODIN) 5-325 MG tablet Take 1 tablet by mouth every 6 (six) hours as needed for moderate pain. 07/19/20 07/19/21 Yes Thadeus Gandolfi L, PA-C  citalopram (CELEXA) 40 MG tablet Take 1 tablet (40 mg total) by mouth daily. 03/31/16   Duanne Limerick, MD  Multiple Vitamin (MULTIVITAMIN) capsule Take 1 capsule by mouth daily.    [provider]    Allergies Patient has no known allergies.  Family History  Problem Relation Age of Onset   Hypertension Mother    Heart attack Father 36       stents   Diabetes Father     Social History Social History   Tobacco Use   Smoking status: Light Smoker    Pack  years: 0.00    Types: Cigarettes, Cigars   Smokeless tobacco: Never  Substance Use Topics   Alcohol use: Yes   Drug use: Yes    Types: Marijuana    Review of Systems Constitutional: No fever/chills Eyes: No visual changes. ENT: No sore throat. Cardiovascular: Denies chest pain. Respiratory: Denies shortness of breath. Gastrointestinal: No abdominal pain.  No nausea, no vomiting.  No diarrhea.  No constipation. Genitourinary: Negative for dysuria. Musculoskeletal: Negative for back pain. Skin: Negative for rash. Neurological: Negative for headaches, focal weakness or numbness.  ____________________________________________   PHYSICAL EXAM:  VITAL SIGNS: ED Triage Vitals  Enc Vitals Group     BP 07/19/20 0940 (!) 150/92     Pulse Rate 07/19/20 0940 100     Resp 07/19/20 0940 18     Temp 07/19/20 0940 97.8 F (36.6 C)     Temp Source 07/19/20 0940 Oral     SpO2 07/19/20 0940 97 %     Weight 07/19/20 0939 261 lb (118.4 kg)     Height 07/19/20 0939 6\' 2"  (1.88 m)     Head Circumference --      Peak Flow --      Pain Score 07/19/20 0939 9     Pain Loc --      Pain Edu? --  Excl. in GC? --     Constitutional: Alert and oriented. Well appearing and in no acute distress.  Tearful prior to exam. Eyes: Conjunctivae are normal.  Head: Atraumatic. Nose: No congestion/rhinnorhea. Mouth/Throat: Mucous membranes are moist.  Oropharynx non-erythematous.  Right upper gum posterior premolar-molar area is edematous and tender.  No active drainage is noted.  There is a cavity present in this area. Neck: No stridor.   Cardiovascular: Normal rate, regular rhythm. Grossly normal heart sounds.  Good peripheral circulation. Respiratory: Normal respiratory effort.  No retractions. Lungs CTAB. Musculoskeletal: Moves upper and lower extremities that any difficulty.  Normal gait was noted. Neurologic:  Normal speech and language. No gross focal neurologic deficits are appreciated. No gait  instability. Skin:  Skin is warm, dry and intact. No rash noted. Psychiatric: Mood and affect are normal. Speech and behavior are normal.  ____________________________________________   LABS (all labs ordered are listed, but only abnormal results are displayed)  Labs Reviewed - No data to display ____________________________________________   PROCEDURES  Procedure(s) performed (including Critical Care):  Procedures   ____________________________________________   INITIAL IMPRESSION / ASSESSMENT AND PLAN / ED COURSE  As part of my medical decision making, I reviewed the following data within the electronic MEDICAL RECORD NUMBER Notes from prior ED visits and [place] Controlled Substance Database  35 year old male presents to the ED with complaint of abscess to his right upper tooth for the last 2 days.  Patient states that the same tooth he has had problems for the last 6 to 8 months but has not seen a dentist.  Patient denies any fever or chills.  There is a cavity present in this area and the gums are tender.  A prescription for clindamycin 300 mg 3 times daily and hydrocodone was sent to his pharmacy.  Is encouraged to make an appointment with his dentist that he is already established with. ____________________________________________   FINAL CLINICAL IMPRESSION(S) / ED DIAGNOSES  Final diagnoses:  Dental abscess  Pain due to dental caries     ED Discharge Orders          Ordered    HYDROcodone-acetaminophen (NORCO/VICODIN) 5-325 MG tablet  Every 6 hours PRN        07/19/20 1018    clindamycin (CLEOCIN) 300 MG capsule  3 times daily        07/19/20 1018             Note:  This document was prepared using Dragon voice recognition software and may include unintentional dictation errors.    Tommi Rumps, PA-C 07/19/20 1025    Phineas Semen, MD 07/19/20 1320

## 2020-07-19 NOTE — ED Notes (Signed)
See triage note  Presents with possible dental abscess   States he has a broken tooth on the right pain started 2 days ago  Increased pain today  Tearful on arrival

## 2020-07-19 NOTE — Discharge Instructions (Addendum)
Call make an appointment with your dentist.  Begin taking antibiotics as directed until completely finished.  The pain medication hydrocodone is for pain only every 6 hours.  Do not drive or operate machinery while taking this medication as it could cause drowsiness and increase your risk for injury.  Take ibuprofen for inflammation which will also help with pain.  Discontinue or decrease smoking as this increases your dental pain.
